# Patient Record
Sex: Female | Born: 1965 | Race: White | Hispanic: No | Smoking: Never smoker
Health system: Southern US, Community
[De-identification: ages and names within clinical notes are randomized; demographics above are authoritative.]

---

## 2021-11-05 ENCOUNTER — Encounter (HOSPITAL_COMMUNITY): Payer: Self-pay

## 2021-11-05 ENCOUNTER — Emergency Department (HOSPITAL_COMMUNITY)
Admission: EM | Admit: 2021-11-05 | Discharge: 2021-11-05 | Disposition: A | Discharge status: Home / Self Care | Payer: Self-pay | Attending: Emergency Medicine | Admitting: Emergency Medicine

## 2021-11-05 ENCOUNTER — Emergency Department (HOSPITAL_COMMUNITY): Payer: Self-pay

## 2021-11-05 DIAGNOSIS — R42 Dizziness and giddiness: Secondary | ICD-10-CM | POA: Insufficient documentation

## 2021-11-05 DIAGNOSIS — Z79899 Other long term (current) drug therapy: Secondary | ICD-10-CM | POA: Insufficient documentation

## 2021-11-05 LAB — COMPREHENSIVE METABOLIC PANEL
ALT: 12 U/L (ref 0–44)
AST: 17 U/L (ref 15–41)
Albumin: 4.2 g/dL (ref 3.5–5.0)
Alkaline Phosphatase: 76 U/L (ref 38–126)
Anion gap: 12 (ref 5–15)
BUN: 14 mg/dL (ref 6–20)
CO2: 24 mmol/L (ref 22–32)
Calcium: 9.6 mg/dL (ref 8.9–10.3)
Chloride: 105 mmol/L (ref 98–111)
Creatinine, Ser: 0.7 mg/dL (ref 0.44–1.00)
GFR, Estimated: >60 mL/min (ref >60)
Glucose, Bld: 157 mg/dL — ABNORMAL HIGH (ref 70–99)
Potassium: 3.7 mmol/L (ref 3.5–5.1)
Sodium: 141 mmol/L (ref 135–145)
Total Bilirubin: 0.3 mg/dL (ref 0.3–1.2)
Total Protein: 7.4 g/dL (ref 6.5–8.1)

## 2021-11-05 LAB — CBC WITH DIFFERENTIAL/PLATELET
Abs Immature Granulocytes: 0.04 10*3/uL (ref 0.00–0.07)
Basophils Absolute: 0.1 10*3/uL (ref 0.0–0.1)
Basophils Relative: 1 %
Eosinophils Absolute: 0.1 10*3/uL (ref 0.0–0.5)
Eosinophils Relative: 1 %
HCT: 40 % (ref 36.0–46.0)
Hemoglobin: 13.3 g/dL (ref 12.0–15.0)
Immature Granulocytes: 0 %
Lymphocytes Relative: 15 %
Lymphs Abs: 1.4 10*3/uL (ref 0.7–4.0)
MCH: 27 pg (ref 26.0–34.0)
MCHC: 33.3 g/dL (ref 30.0–36.0)
MCV: 81.1 fL (ref 80.0–100.0)
Monocytes Absolute: 0.4 10*3/uL (ref 0.1–1.0)
Monocytes Relative: 5 %
Neutro Abs: 7.2 10*3/uL (ref 1.7–7.7)
Neutrophils Relative %: 78 %
Platelets: 289 10*3/uL (ref 150–400)
RBC: 4.93 MIL/uL (ref 3.87–5.11)
RDW: 13.8 % (ref 11.5–15.5)
WBC: 9.2 10*3/uL (ref 4.0–10.5)
nRBC: 0 % (ref 0.0–0.2)

## 2021-11-05 LAB — URINALYSIS, ROUTINE W REFLEX MICROSCOPIC
Bilirubin Urine: NEGATIVE
Glucose, UA: NEGATIVE mg/dL
Hgb urine dipstick: NEGATIVE
Ketones, ur: NEGATIVE mg/dL
Nitrite: NEGATIVE
Protein, ur: NEGATIVE mg/dL
Specific Gravity, Urine: 1.017 (ref 1.005–1.030)
pH: 7 (ref 5.0–8.0)

## 2021-11-05 LAB — TROPONIN I (HIGH SENSITIVITY)
Troponin I (High Sensitivity): 6 ng/L (ref <18)
Troponin I (High Sensitivity): 8 ng/L (ref <18)

## 2021-11-05 MED ORDER — MECLIZINE HCL 25 MG PO TABS
25.0000 mg | ORAL_TABLET | Freq: Three times a day (TID) | ORAL | 0 refills | Status: DC | PRN
Start: 2021-11-05 — End: 2022-10-09

## 2021-11-05 NOTE — ED Provider Notes (Signed)
[facility] EMERGENCY DEPARTMENT Provider Note   CSN: [id number] Arrival date & time: 11/05/21  1557     History  Chief Complaint  Patient presents with   Dizziness    Meredith Ward is a 56 y.o. female with a past medical history significant for right-sided tinnitus for years who presents with concern for 2 episodes of acute onset dizziness, "feeling like she was drunk" since yesterday.  Patient reports that the initial episode happened she was woken up from sleep around 4 a 5 AM, was evaluated at [facility], was found to be somewhat hypothermic at the time, warmed up with bear hugger, no other acute findings noted, spontaneous resolution of symptoms, and patient discharged home.  Patient had a return of acute onset dizziness, feeling like she was drunk, with some inability to perform normal tasks including parking car, finding herself in the world, balance.  With these episodes lasted for a few hours before spontaneous resolution.  Patient has no ongoing dizziness, facial droop, weakness.  She does endorse a mild headache but reports that she has not had her normal caffeine for the day.  Patient denies prior history of strokes, blood clots.   Dizziness     Home Medications Prior to Admission medications   Medication Sig Start Date End Date Taking? Authorizing Provider  meclizine (ANTIVERT) 25 MG tablet Take 1 tablet (25 mg total) by mouth 3 (three) times daily as needed for dizziness. 11/05/21  Yes Shanard Treto H, PA-C      Allergies    Patient has no known allergies.    Review of Systems   Review of Systems  Neurological:  Positive for dizziness.  All other systems reviewed and are negative.  Physical Exam Updated Vital Signs BP (!) 155/89    Pulse 71    Temp 98 F (36.7 C)    Resp 16    Ht 5\' 6"  (1.676 m)    Wt 72.6 kg    SpO2 98%    BMI 25.82 kg/m  Physical Exam Vitals and nursing note reviewed.  Constitutional:      General: She is not  in acute distress.    Appearance: Normal appearance.  HENT:     Head: Normocephalic and atraumatic.     Right Ear: Tympanic membrane, ear canal and external ear normal. There is no impacted cerumen.     Left Ear: Tympanic membrane, ear canal and external ear normal. There is no impacted cerumen.  Eyes:     General:        Right eye: No discharge.        Left eye: No discharge.  Cardiovascular:     Rate and Rhythm: Normal rate and regular rhythm.     Heart sounds: No murmur heard.   No friction rub. No gallop.  Pulmonary:     Effort: Pulmonary effort is normal.     Breath sounds: Normal breath sounds.  Abdominal:     General: Bowel sounds are normal.     Palpations: Abdomen is soft.  Skin:    General: Skin is warm and dry.     Capillary Refill: Capillary refill takes less than 2 seconds.  Neurological:     Mental Status: She is alert and oriented to person, place, and time.     Comments: Cranial nerves II through XII grossly intact.  Intact finger-nose, intact heel-to-shin.  Romberg negative, gait normal.  Alert and oriented x3.  Moves all 4 limbs spontaneously, normal  coordination.  No pronator drift.  Intact strength 5 out of 5 bilateral upper and lower extremities.    Psychiatric:        Mood and Affect: Mood normal.        Behavior: Behavior normal.    ED Results / Procedures / Treatments   Labs (all labs ordered are listed, but only abnormal results are displayed) Labs Reviewed  COMPREHENSIVE METABOLIC PANEL - Abnormal; Notable for the following components:      Result Value   Glucose, Bld 157 (*)    All other components within normal limits  URINALYSIS, ROUTINE W REFLEX MICROSCOPIC - Abnormal; Notable for the following components:   APPearance CLOUDY (*)    Leukocytes,Ua MODERATE (*)    Bacteria, UA FEW (*)    All other components within normal limits  RAPID URINE DRUG SCREEN, HOSP PERFORMED - Abnormal; Notable for the following components:   Opiates NEGATIVE (*)     Cocaine NEGATIVE (*)    Benzodiazepines NEGATIVE (*)    Amphetamines NEGATIVE (*)    Tetrahydrocannabinol NEGATIVE (*)    Barbiturates NEGATIVE (*)    All other components within normal limits  CBC WITH DIFFERENTIAL/PLATELET  TROPONIN I (HIGH SENSITIVITY)  TROPONIN I (HIGH SENSITIVITY)    EKG EKG Interpretation  Date/Time:  Monday November 05 2021 16:09:24 EST Ventricular Rate:  68 PR Interval:  144 QRS Duration: 98 QT Interval:  388 QTC Calculation: 412 R Axis:   77 Text Interpretation: Normal sinus rhythm Right atrial enlargement Incomplete right bundle branch block Borderline ECG No previous ECGs available Confirmed by Linwood Dibbles [phone]) on 11/05/2021 10:43:09 PM  Radiology DG Chest 2 View  Result Date: 11/05/2021 CLINICAL DATA:  Chest pain EXAM: CHEST - 2 VIEW COMPARISON:  03/27/2020 FINDINGS: The heart size and mediastinal contours are within normal limits. Atherosclerotic calcification of the aortic knob. No focal airspace consolidation, pleural effusion, or pneumothorax. The visualized skeletal structures are unremarkable. IMPRESSION: No active cardiopulmonary disease. Electronically Signed   By: Duanne Guess D.O.   On: 11/05/2021 16:52   CT Head Wo Contrast  Result Date: 11/05/2021 CLINICAL DATA:  Dizziness, persistent and recurrent, cardiac or vascular cause suspected. New or worsening headache. EXAM: CT HEAD WITHOUT CONTRAST TECHNIQUE: Contiguous axial images were obtained from the base of the skull through the vertex without intravenous contrast. RADIATION DOSE REDUCTION: This exam was performed according to the departmental dose-optimization program which includes automated exposure control, adjustment of the mA and/or kV according to patient size and/or use of iterative reconstruction technique. COMPARISON:  03/27/2020 FINDINGS: Brain: No evidence of acute infarction, hemorrhage, hydrocephalus, extra-axial collection or mass lesion/mass effect. Patchy white matter  disease of nonspecific etiology, unchanged. Vascular: No hyperdense vessel or unexpected calcification. Skull: Calvarium appears intact. Sinuses/Orbits: Paranasal sinuses and mastoid air cells are clear. Other: None. IMPRESSION: No acute intracranial abnormalities. Mild patchy white matter changes of nonspecific etiology, unchanged. Electronically Signed   By: Burman Nieves M.D.   On: 11/05/2021 17:23    Procedures Procedures    Medications Ordered in ED Medications - No data to display  ED Course/ Medical Decision Making/ A&P                           Medical Decision Making This patient presents to the ED for concern of acute onset dizziness x2, this involves an extensive number of treatment options, and is a complaint that carries with it a high risk of  complications and morbidity. The emergent differential diagnosis includes, but is not limited to, peripheral acoustic lesion, central acoustic lesion, acute stroke, BPPV, labyrinthitis versus other.   Co morbidities that complicate the patient evaluation: History of tinnitus on the right Social Determinants of Health: Patient has good access to care, good compliance  Additional history obtained from patient's daughter. External records from outside source obtained and reviewed including previous emergency department evaluation from yesterday.  Physical Exam: Physical exam performed. The pertinent findings include: Nonfocal neurologic exam, overall well-appearing patient, no evidence of lesion or other abnormality bilateral ears  Lab Tests: I Ordered, and personally interpreted labs.  The pertinent results include: Unremarkable CBC, CMP with mildly elevated blood glucose, 157.  Negative troponin x2, negative UDS, urinalysis with moderate leukocytes, few bacteria in context of no urinary tract infection symptoms.   Imaging Studies: I ordered imaging studies including CT head without contrast, plain film radiograph of the chest. I  independently visualized and interpreted imaging which showed no evidence of acute intracranial abnormality, large brain bleed, no evidence of intrathoracic abnormality, pneumonia, or other infiltrate. I agree with the radiologist interpretation.   Cardiac Monitoring:  The patient was maintained on a cardiac monitor.  My attending physician Dr. Lynelle Doctor viewed and interpreted the cardiac monitored which showed an underlying rhythm of: incomplete right bundle branch, some right atrial enlargement, no evidence of ischemia   Disposition: After consideration of the diagnostic results and the patients response to treatment, I feel that patient would benefit from follow-up with ENT for possible evaluation of Mnire's disease.  Consider admission for this patient for possible TIA work-up, however I think that it is fairly improbable that patient had isolated TIA of the posterior circulation with no residual symptoms x2.  She has no focal neurologic deficits on my exam today, I have no clinical concern for an acute stroke at this time.  We will discharge patient with prescription for Antivert, and plan to follow-up with ENT at her earliest convenience.  Patient or stands and agrees to plan, discharged in stable condition at this time.    Final Clinical Impression(s) / ED Diagnoses Final diagnoses:  Dizziness    Rx / DC Orders ED Discharge Orders          Ordered    meclizine (ANTIVERT) 25 MG tablet  3 times daily PRN        11/05/21 2318              Alicen Donalson, Harrel Carina, PA-C 11/06/21 0034    Linwood Dibbles, MD 11/07/21 0002

## 2021-11-05 NOTE — Discharge Instructions (Addendum)
As we discussed I have some suspicion that you may have something called Mnire's disease.  This can be tied to caffeine intake, sometimes associated with sodium.  Decreasing sodium in your diet, your caffeine intake may help improve your symptoms.  I prescribed you a medication called Antivert which may help with the symptoms when they occur.  However I do recommend further evaluation by ENT, for formal diagnosis.  If you are formally diagnosed with this condition, they will sometimes place you on a diuretic which is a medication that can help to decrease your symptoms (because of how it helps you to excrete excess sodium).  The rest of your lab work and evaluation was unremarkable today.  If you have worsening symptoms, or persistence of the symptoms recommend the return to the emergency department for further evaluation, otherwise follow-up with ENT.  It was a pleasure taking care of you today, I appreciate your patience today.

## 2021-11-05 NOTE — ED Provider Triage Note (Signed)
Emergency Medicine Provider Triage Evaluation Note  Meredith Ward , a 56 y.o. female  was evaluated in triage.  Pt complains of headache, chest pain, and dizziness. She was seen at [facility] yesterday for the same however did not have chest pain. She states that since waking up yesterday she has felt globally weak and very dizzy like she has a few too many drinks however she denies any alcohol use. She states that her symptoms have been intermittent.  Today they started after she drove at work about an hour prior to arrival. She denies any fevers.  She did not have any CT imaging done yesterday on her head.  Yesterday her COVID test were negative.  Reports feeling very nauseous.  Physical Exam  BP (!) 166/113    Pulse 62    Temp 97.7 F (36.5 C) (Oral)    Resp 16    Ht 5\' 6"  (1.676 m)    Wt 72.6 kg    SpO2 100%    BMI 25.82 kg/m  Gen:   Awake,\appears uncomfortable  Resp:  Normal effort  MSK:   Moves extremities without difficulty  Other:  Normal finger-nose-finger bilaterally.  Full EOMs without nystagmus.  Medical Decision Making  Medically screening exam initiated at 4:32 PM.  Appropriate orders placed.  Arnelle Nale was informed that the remainder of the evaluation will be completed by another provider, this initial triage assessment does not replace that evaluation, and the importance of remaining in the ED until their evaluation is complete.     Nicholes Calamity, Cristina Gong 11/05/21 [phone]

## 2021-11-05 NOTE — ED Triage Notes (Signed)
Pt arrives POV for eval of dizziness and "feeling like she is drunk". Pt was seen yesterday at [facility] for same w/ no conclusion as to cause. Did not receive a CT scan yesterday. Pt reports this dizziness has come and gone, but felt worse again this morning. Denies weakness/numbness.

## 2021-11-06 LAB — RAPID URINE DRUG SCREEN, HOSP PERFORMED
Amphetamines: NOT DETECTED
Barbiturates: NOT DETECTED
Benzodiazepines: NOT DETECTED
Cocaine: NOT DETECTED
Opiates: NOT DETECTED
Tetrahydrocannabinol: NOT DETECTED

## 2022-08-09 ENCOUNTER — Ambulatory Visit (INDEPENDENT_AMBULATORY_CARE_PROVIDER_SITE_OTHER): Payer: Self-pay | Admitting: Internal Medicine

## 2022-08-09 ENCOUNTER — Encounter: Payer: Self-pay | Admitting: Internal Medicine

## 2022-08-09 VITALS — BP 128/88 | HR 94 | Temp 98.4°F | Resp 18 | Ht 67.0 in | Wt 160.2 lb

## 2022-08-09 DIAGNOSIS — R590 Localized enlarged lymph nodes: Secondary | ICD-10-CM

## 2022-08-09 DIAGNOSIS — J029 Acute pharyngitis, unspecified: Secondary | ICD-10-CM

## 2022-08-09 MED ORDER — IBUPROFEN 600 MG PO TABS: 600.0000 mg | ORAL_TABLET | Freq: Four times a day (QID) | ORAL | 0 refills | Status: DC | PRN

## 2022-08-09 MED ORDER — AMOXICILLIN-POT CLAVULANATE 875-125 MG PO TABS: 1.0000 | ORAL_TABLET | Freq: Two times a day (BID) | ORAL | 0 refills | Status: DC

## 2022-08-09 NOTE — Progress Notes (Signed)
   Acute Office Visit  Subjective:     Patient ID: Meredith Ward, female    DOB: Dec 28, 1965, 56 y.o.   MRN: [id number]  Chief Complaint  Patient presents with   Swollen Lymphnodes   Fever   Sore Throat    Fever  This is a new problem. The current episode started in the past 7 days. The problem occurs intermittently. The problem has been unchanged. Her temperature was unmeasured prior to arrival. Associated symptoms include a sore throat. Pertinent negatives include no abdominal pain, chest pain or ear pain. She has tried nothing for the symptoms. The treatment provided no relief.  Sore Throat  This is a new problem. The current episode started in the past 7 days. The problem has been unchanged. The pain is worse on the right side. The fever has been present for 3 to 4 days. The pain is moderate. Pertinent negatives include no abdominal pain or ear pain. She has tried nothing for the symptoms. The treatment provided no relief.   Patient is in today for right side sore throat with right neck swelling started 5 days ago.   Review of Systems  Constitutional:  Positive for fever.  HENT:  Positive for sore throat. Negative for ear pain.   Cardiovascular:  Negative for chest pain.  Gastrointestinal:  Negative for abdominal pain.        Objective:    BP 128/88 (BP Location: Left Arm, Patient Position: Sitting)   Pulse 94   Temp 98.4 F (36.9 C)   Resp 18   Ht 5\' 7"  (1.702 m)   Wt 160 lb 4 oz (72.7 kg)   SpO2 97%   BMI 25.10 kg/m    Physical Exam Constitutional:      Appearance: She is well-developed.  HENT:     Head: Normocephalic and atraumatic.     Mouth/Throat:     Pharynx: Pharyngeal swelling present.     Tonsils: 3+ on the right.  Cardiovascular:     Rate and Rhythm: Normal rate and regular rhythm.  Musculoskeletal:     Cervical back: Normal range of motion.  Neurological:     General: No focal deficit present.     Mental Status: She is alert.     No results  found for any visits on 08/09/22.      Assessment & Plan:   Problem List Items Addressed This Visit   None Visit Diagnoses     Sore throat    -  Primary   Relevant Medications   amoxicillin-clavulanate (AUGMENTIN) 875-125 MG tablet   ibuprofen (ADVIL) 600 MG tablet   Other Relevant Orders   POCT rapid strep A   Cervical lymphadenopathy       Relevant Medications   amoxicillin-clavulanate (AUGMENTIN) 875-125 MG tablet   ibuprofen (ADVIL) 600 MG tablet   Other Relevant Orders   POCT rapid strep A       Meds ordered this encounter  Medications   amoxicillin-clavulanate (AUGMENTIN) 875-125 MG tablet    Sig: Take 1 tablet by mouth 2 (two) times daily.    Dispense:  14 tablet    Refill:  0   ibuprofen (ADVIL) 600 MG tablet    Sig: Take 1 tablet (600 mg total) by mouth every 6 (six) hours as needed.    Dispense:  30 tablet    Refill:  0    Return in about 1 week (around 08/16/2022).  08/18/2022, MD

## 2022-10-09 ENCOUNTER — Encounter: Payer: Self-pay | Admitting: Internal Medicine

## 2022-10-09 ENCOUNTER — Ambulatory Visit: Payer: Self-pay | Admitting: Internal Medicine

## 2022-10-09 VITALS — BP 138/100 | HR 70 | Temp 97.8°F | Resp 18 | Ht 66.0 in | Wt 165.1 lb

## 2022-10-09 DIAGNOSIS — J00 Acute nasopharyngitis [common cold]: Secondary | ICD-10-CM

## 2022-10-09 DIAGNOSIS — H8101 Meniere's disease, right ear: Secondary | ICD-10-CM | POA: Insufficient documentation

## 2022-10-09 MED ORDER — BACITRACIN 500 UNIT/GM EX OINT: 1.0000 | TOPICAL_OINTMENT | Freq: Two times a day (BID) | CUTANEOUS | 0 refills | Status: DC

## 2022-10-09 MED ORDER — FEXOFENADINE HCL 180 MG PO TABS: 180.0000 mg | ORAL_TABLET | Freq: Every day | ORAL | 1 refills | Status: DC

## 2022-10-09 MED ORDER — MECLIZINE HCL 25 MG PO TABS: 25.0000 mg | ORAL_TABLET | Freq: Three times a day (TID) | ORAL | 3 refills | Status: DC | PRN

## 2022-10-09 NOTE — Progress Notes (Signed)
   Office Visit  Subjective   Patient ID: Meredith Ward   DOB: March 01, 1966   Age: 57 y.o.   MRN: [id number]   Chief Complaint Chief Complaint  Patient presents with   Follow-up     History of Present Illness HPI   Past Medical History No past medical history on file.   Allergies No Known Allergies   Review of Systems ROS     Objective:    Vitals BP (!) 138/100 (BP Location: Left Arm, Patient Position: Sitting, Cuff Size: Normal)   Pulse 70   Temp 97.8 F (36.6 C)   Resp 18   Ht 5\' 6"  (1.676 m)   Wt 165 lb 2 oz (74.9 kg)   SpO2 96%   BMI 26.65 kg/m    Physical Examination Physical Exam     Assessment & Plan:   No problem-specific Assessment & Plan notes found for this encounter.    No follow-ups on file.   Garwin Brothers, MD

## 2022-10-10 NOTE — Assessment & Plan Note (Signed)
Continue using meclizine.

## 2022-10-10 NOTE — Assessment & Plan Note (Signed)
I will give her antihistamine and nasal spray she will use during this time of the year.  If her symptoms are not better then she will call.

## 2022-10-15 ENCOUNTER — Other Ambulatory Visit: Payer: Self-pay

## 2022-10-15 MED ORDER — MECLIZINE HCL 25 MG PO TABS: 25.0000 mg | ORAL_TABLET | Freq: Three times a day (TID) | ORAL | 3 refills | Status: DC | PRN

## 2022-10-15 MED ORDER — MECLIZINE HCL 25 MG PO TABS: 25.0000 mg | ORAL_TABLET | Freq: Three times a day (TID) | ORAL | 3 refills | Status: AC | PRN

## 2023-07-28 ENCOUNTER — Ambulatory Visit: Payer: Self-pay | Admitting: Student

## 2023-07-28 ENCOUNTER — Encounter: Payer: Self-pay | Admitting: Student

## 2023-07-28 VITALS — BP 130/86 | HR 89 | Temp 98.1°F | Resp 18 | Ht 66.0 in | Wt 173.0 lb

## 2023-07-28 DIAGNOSIS — J029 Acute pharyngitis, unspecified: Secondary | ICD-10-CM

## 2023-07-28 DIAGNOSIS — R197 Diarrhea, unspecified: Secondary | ICD-10-CM

## 2023-07-28 DIAGNOSIS — H6502 Acute serous otitis media, left ear: Secondary | ICD-10-CM | POA: Insufficient documentation

## 2023-07-28 DIAGNOSIS — H8101 Meniere's disease, right ear: Secondary | ICD-10-CM

## 2023-07-28 LAB — POC INFLUENZA A&B (BINAX/QUICKVUE)
Influenza A, POC: NEGATIVE
Influenza B, POC: NEGATIVE

## 2023-07-28 LAB — POC COVID19 BINAXNOW: SARS Coronavirus 2 Ag: NEGATIVE

## 2023-07-28 MED ORDER — CEFDINIR 300 MG PO CAPS: 600.0000 mg | ORAL_CAPSULE | Freq: Every day | ORAL | 0 refills | Status: AC

## 2023-07-28 MED ORDER — CEFDINIR 300 MG PO CAPS: 600.0000 mg | ORAL_CAPSULE | Freq: Two times a day (BID) | ORAL | 0 refills | Status: DC

## 2023-07-28 MED ORDER — ONDANSETRON HCL 4 MG PO TABS: 4.0000 mg | ORAL_TABLET | Freq: Three times a day (TID) | ORAL | 0 refills | Status: AC | PRN

## 2023-07-28 MED ORDER — CLONAZEPAM 0.25 MG PO TBDP: 0.1250 mg | ORAL_TABLET | Freq: Two times a day (BID) | ORAL | 0 refills | Status: DC | PRN

## 2023-07-28 NOTE — Assessment & Plan Note (Signed)
For acute flare she will use clonazepam 0.5 mg twice a day as needed and zofran 4 mg as needed for nausea and vomiting. Instructions provided for hydration and rest.  A work note was signed for the patient.

## 2023-07-28 NOTE — Progress Notes (Signed)
Established Patient Office Visit  Subjective   Patient ID: Meredith Ward, female    DOB: March 04, 1966  Age: 57 y.o. MRN: [id number]  Chief Complaint  Patient presents with   Dizziness    One and a half years ago dx. [facility] dx. Since last Tues. Vomitting, diarrhea, dizziness. Constant ringing in right ear. Has had a sore throat on and off.    HPI  Meredith Ward is a 57 year old female who presents with complaints of vomiting, diarrhea and ringing in right ear, and dizziness. Her initial symptoms began 2 weeks ago with sore throat, runny nose, and mucus production. On last week Wednesday she was at work when she experienced the room began spinning and she vomited. She has a history of meniere's disease diagnosed 1.5 years ago, she has meclizine but she threw that up also. She reports this is her worst flare since being diagnosed. To support diagnosis she has eliminated foods that increase symptoms.  She denies syncope, extremity weakness,    Review of Systems  Constitutional: Negative.   HENT:  Positive for tinnitus.   Eyes: Negative.   Respiratory: Negative.    Cardiovascular: Negative.   Gastrointestinal:  Positive for diarrhea, nausea and vomiting.  Musculoskeletal: Negative.   Skin: Negative.   Neurological:  Positive for dizziness.      Objective:     BP 130/86 (BP Location: Left Arm, Patient Position: Sitting)   Pulse 89   Temp 98.1 F (36.7 C)   Resp 18   Ht 5\' 6"  (1.676 m)   Wt 173 lb (78.5 kg)   SpO2 99%   BMI 27.92 kg/m    Physical Exam Vitals reviewed.  Constitutional:      Appearance: She is ill-appearing.  HENT:     Head: Normocephalic and atraumatic.     Right Ear: Tympanic membrane, ear canal and external ear normal. Decreased hearing noted. No swelling or tenderness.     Left Ear: Hearing normal. No swelling or tenderness. A middle ear effusion is present. Tympanic membrane is retracted and bulging. Tympanic membrane has decreased mobility.      Nose: Congestion present.     Mouth/Throat:     Mouth: Mucous membranes are moist.  Eyes:     Conjunctiva/sclera: Conjunctivae normal.  Cardiovascular:     Rate and Rhythm: Normal rate and regular rhythm.     Pulses: Normal pulses.     Heart sounds: Normal heart sounds.  Pulmonary:     Effort: Pulmonary effort is normal.     Breath sounds: Normal breath sounds.  Abdominal:     General: Bowel sounds are normal.     Palpations: Abdomen is soft.  Musculoskeletal:        General: Normal range of motion.  Skin:    General: Skin is warm and dry.     Capillary Refill: Capillary refill takes less than 2 seconds.     Coloration: Skin is pale.  Neurological:     Mental Status: She is alert and oriented to person, place, and time.     Cranial Nerves: No cranial nerve deficit.     Sensory: No sensory deficit.     Motor: No weakness.     Coordination: Coordination normal.  Psychiatric:        Mood and Affect: Mood normal.        Behavior: Behavior normal.      Results for orders placed or performed in visit on 07/28/23  POC Influenza A&B(BINAX/QUICKVUE)  Result Value Ref Range   Influenza A, POC Negative Negative   Influenza B, POC Negative Negative  POC COVID-19 BinaxNow  Result Value Ref Range   SARS Coronavirus 2 Ag Negative Negative      The ASCVD Risk score (Arnett DK, et al., 2019) failed to calculate for the following reasons:   Cannot find a previous HDL lab   Cannot find a previous total cholesterol lab    Assessment & Plan:   Problem List Items Addressed This Visit     Meniere disease, right - Primary    For acute flare she will use clonazepam 0.5 mg twice a day as needed and zofran 4 mg as needed for nausea and vomiting. Instructions provided for hydration and rest.  A work note was signed for the patient.       Relevant Medications   ondansetron (ZOFRAN) 4 MG tablet   Acute serous otitis media of left ear    I will treat with cefdinir 300 mg twice a day        Relevant Medications   cefdinir (OMNICEF) 300 MG capsule   Other Visit Diagnoses     Sore throat       Relevant Orders   POC Influenza A&B(BINAX/QUICKVUE) (Completed)   Diarrhea, unspecified type       Relevant Orders   POC COVID-19 BinaxNow (Completed)       No follow-ups on file.    Edwena Blow, NP

## 2023-07-28 NOTE — Assessment & Plan Note (Signed)
I will treat with cefdinir 300 mg twice a day

## 2023-07-28 NOTE — Patient Instructions (Addendum)
Use clonazepam twice daily as needed, be sure to not drive or make any important decisions while using this.

## 2023-11-30 IMAGING — DX DG CHEST 2V
2 series · 2 of 2 positions shown · non-contrast
Comparison: 03/27/2020

CLINICAL DATA: Chest pain

EXAM:
CHEST - 2 VIEW

[chest pa]
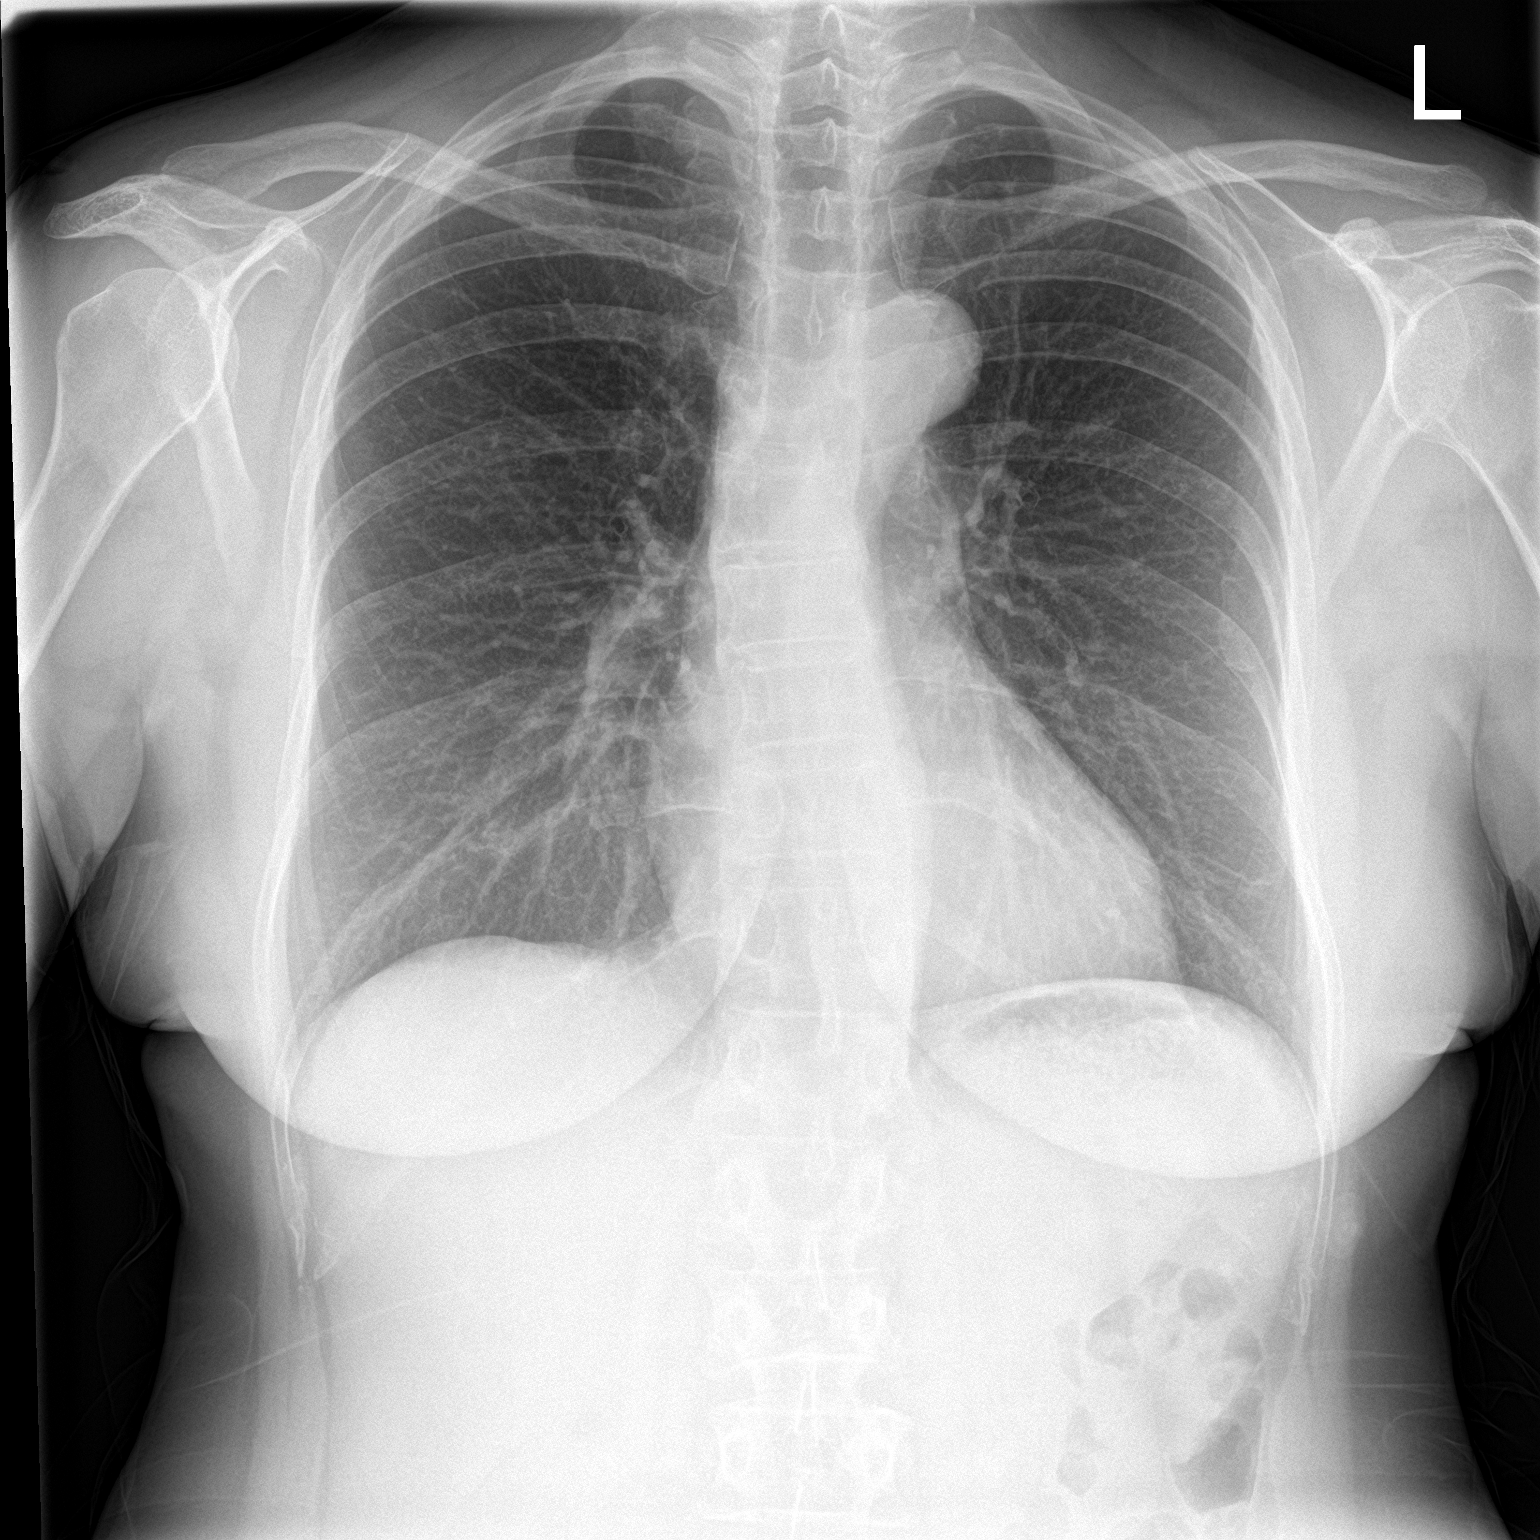

[chest lat]
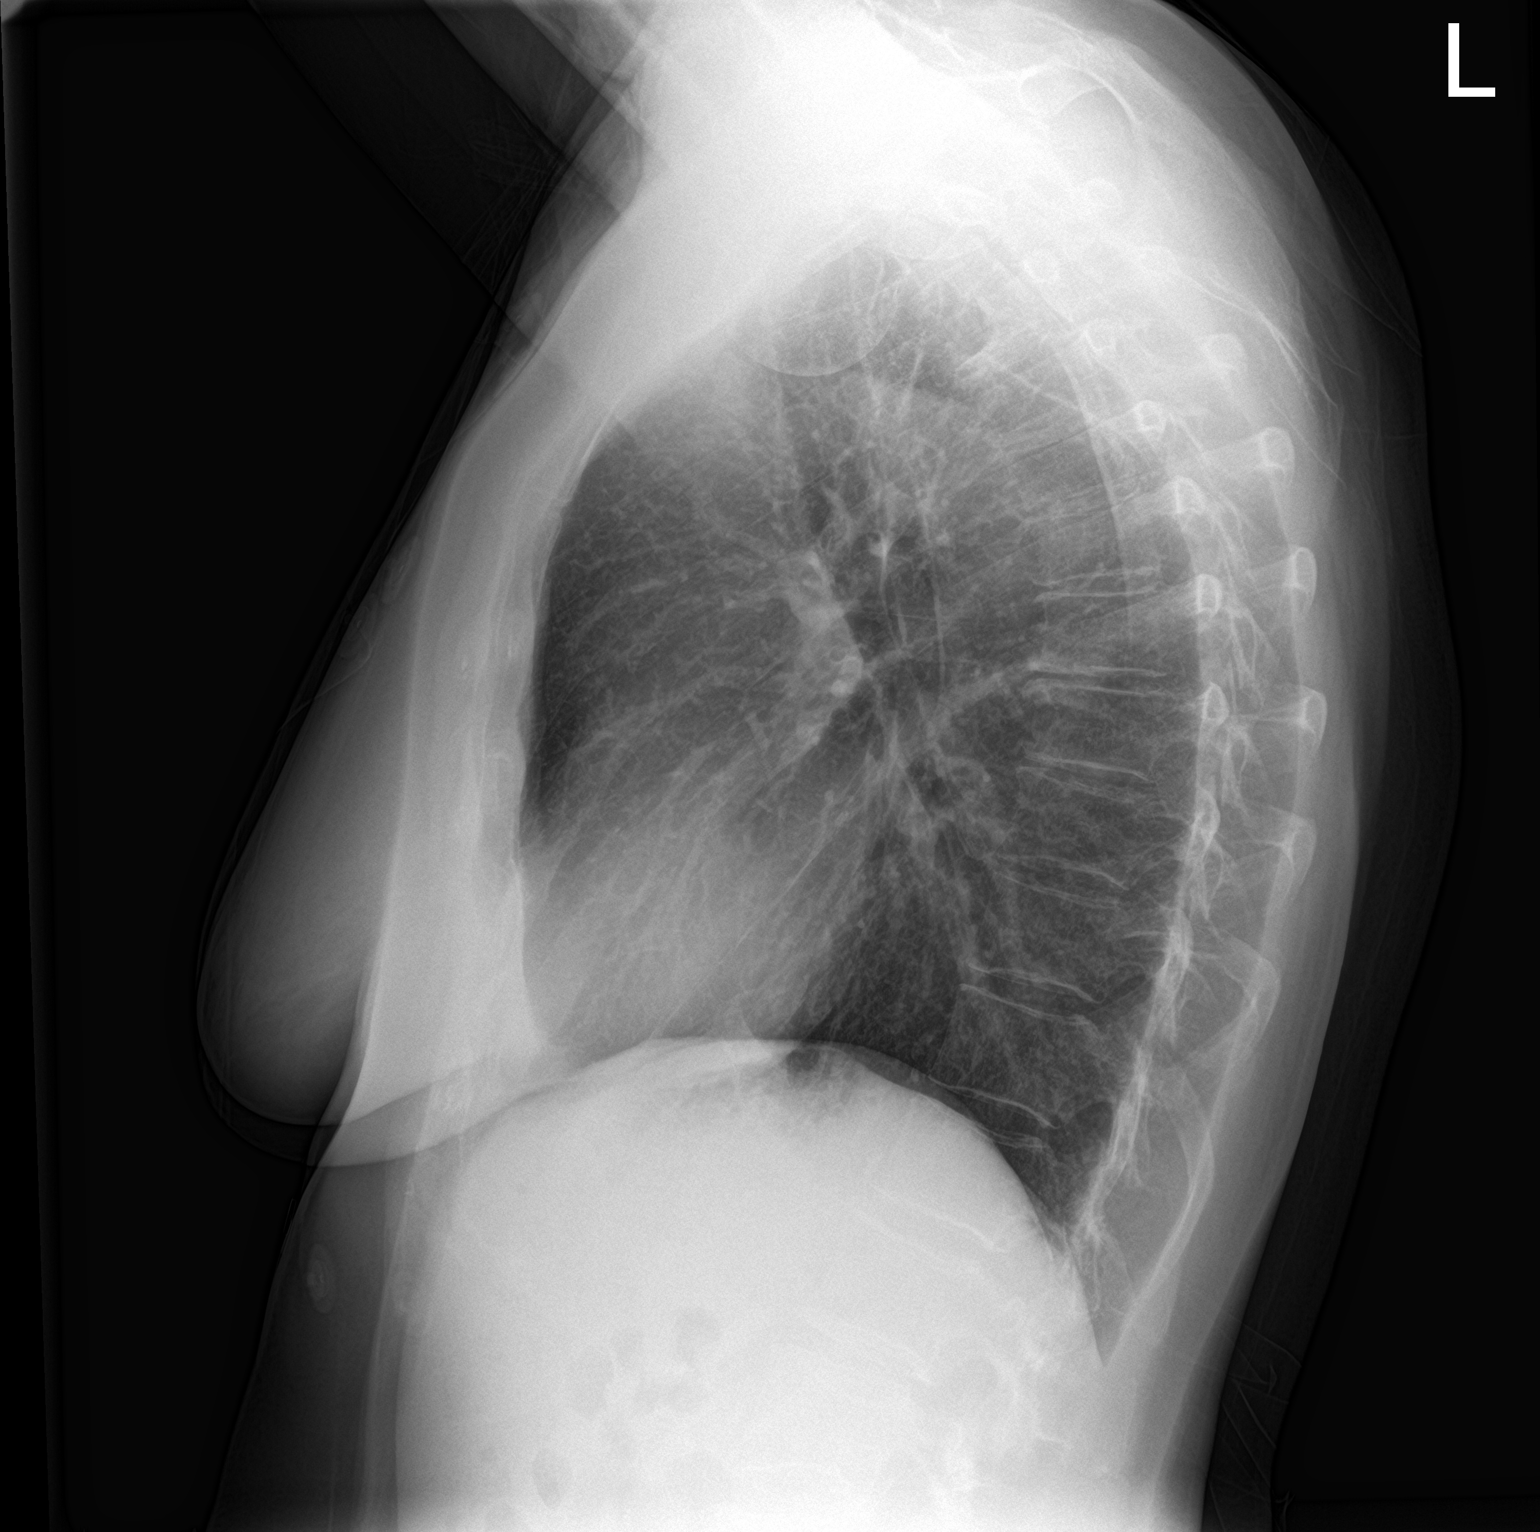

[2 of 2 positions shown; findings below may reference images not displayed]

FINDINGS: The heart size and mediastinal contours are within normal limits.
Atherosclerotic calcification of the aortic knob. No focal airspace
consolidation, pleural effusion, or pneumothorax. The visualized
skeletal structures are unremarkable.
IMPRESSION: No active cardiopulmonary disease.

## 2023-11-30 IMAGING — CT CT HEAD W/O CM
3 of 4 series · 16 of 47 positions shown, 19 images · non-contrast
Comparison: 03/27/2020

CLINICAL DATA: Dizziness, persistent and recurrent, cardiac or
vascular cause suspected. New or worsening headache.



[Series 3: head 2.0 h70h · axial · 0.44mm/px · z∈[-59,+79]mm · 10 of 77 slices shown, 13 images]
[im 4/77  brain]
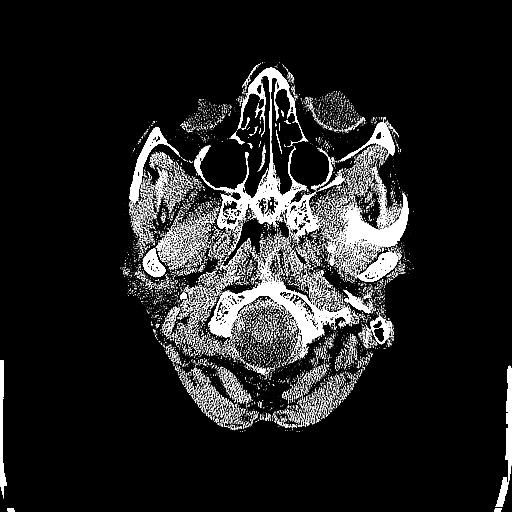
[im 4/77  bone]
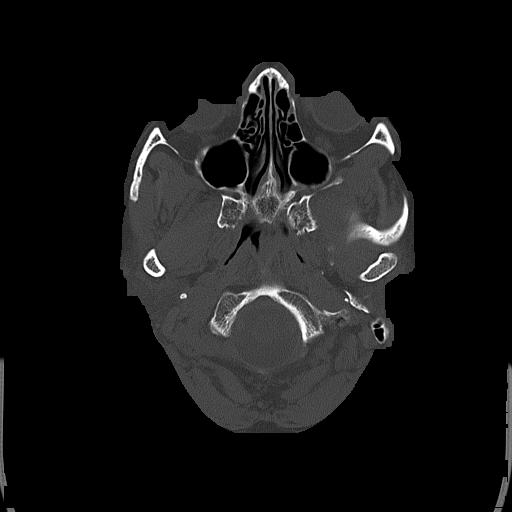
[im 12/77  brain]
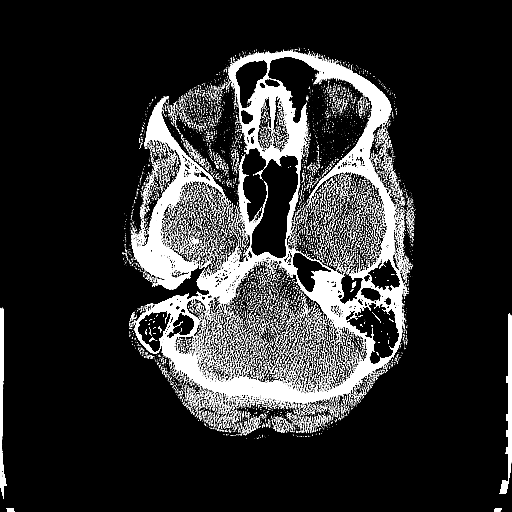
[im 20/77  brain]
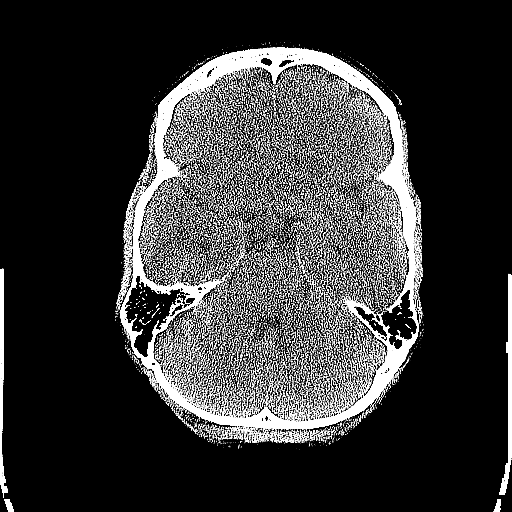
[im 27/77  brain]
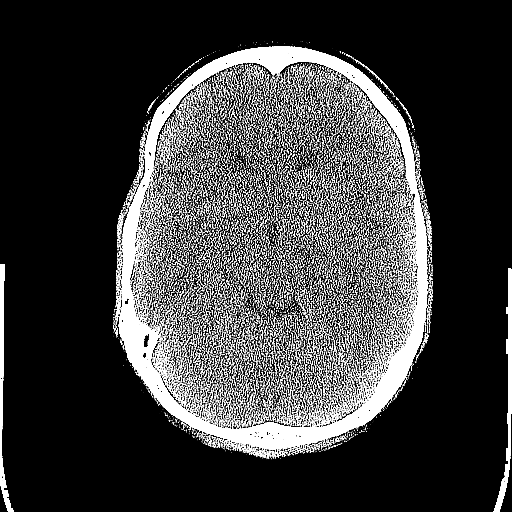
[im 35/77  brain]
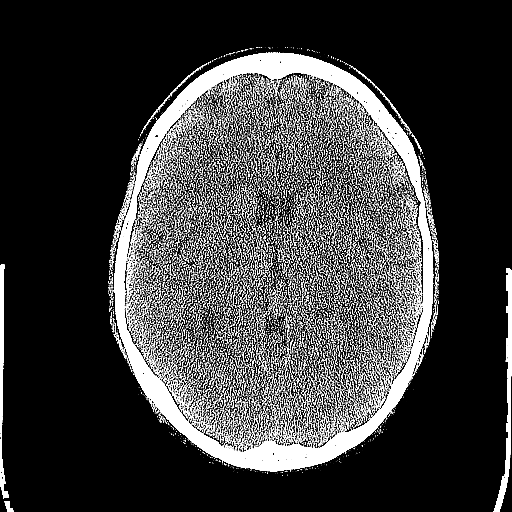
[im 35/77  bone]
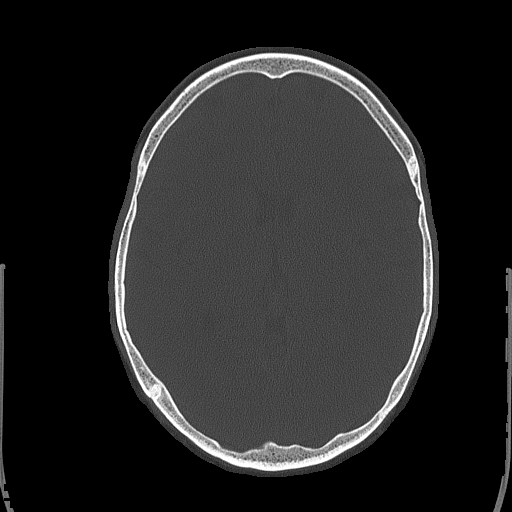
[im 42/77  brain]
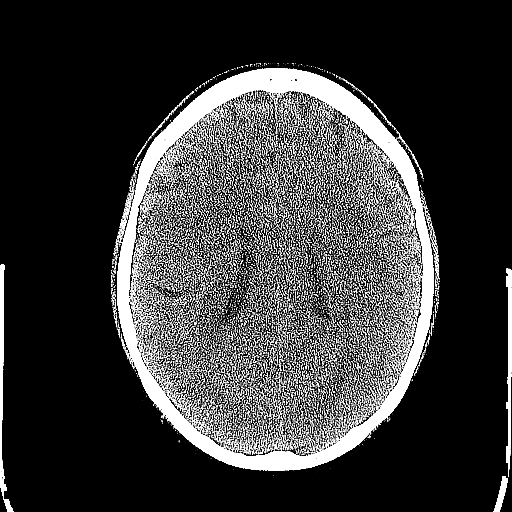
[im 50/77  brain]
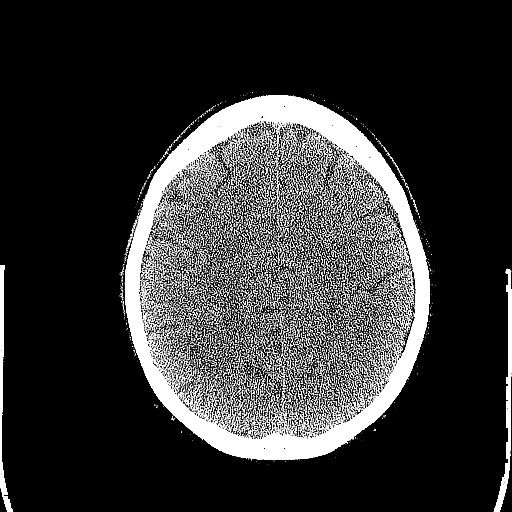
[im 58/77  brain]
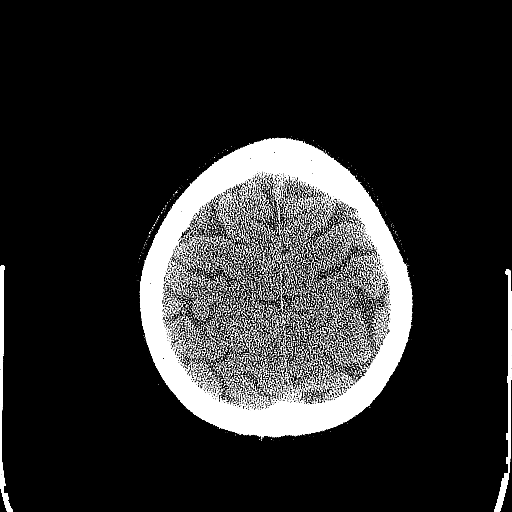
[im 65/77  brain]
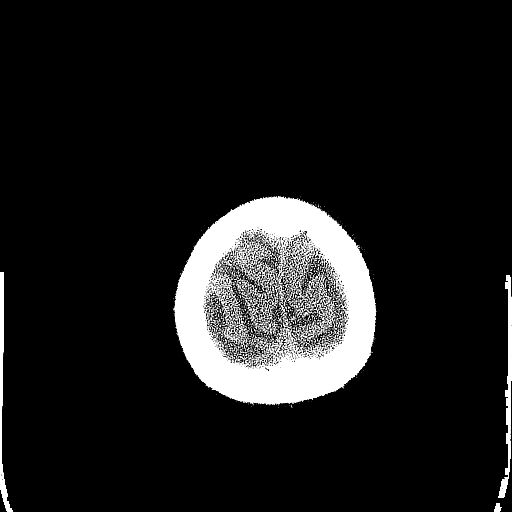
[im 65/77  bone]
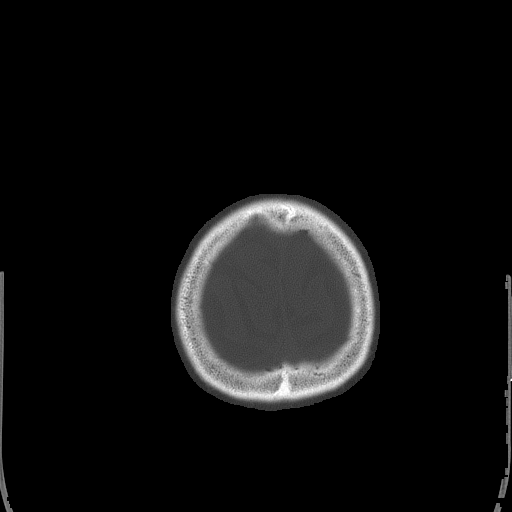
[im 73/77  brain]
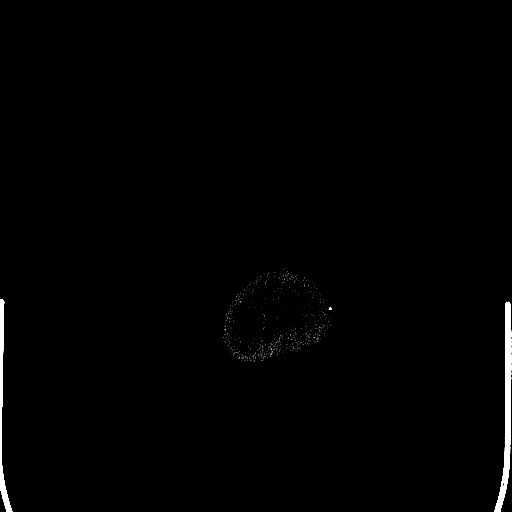

[Series 5: head 3.0 mpr cor · coronal · 0.31mm/px · 3 of 67 slices shown]
[im 23/67  brain]
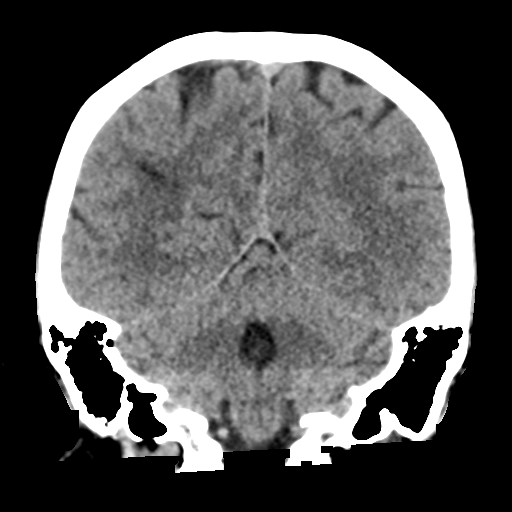
[im 30/67  brain]
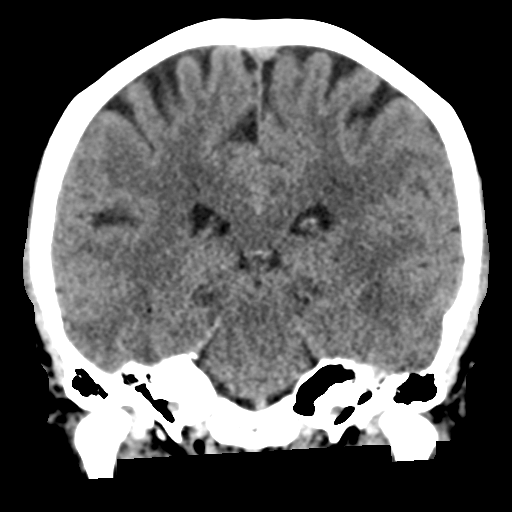
[im 37/67  brain]
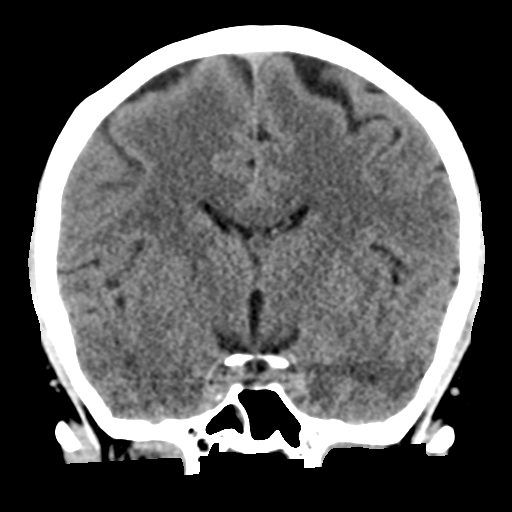

[Series 6: head 3.0 mpr sag · sagittal · 0.31mm/px · 3 of 53 slices shown]
[im 18/53  brain]
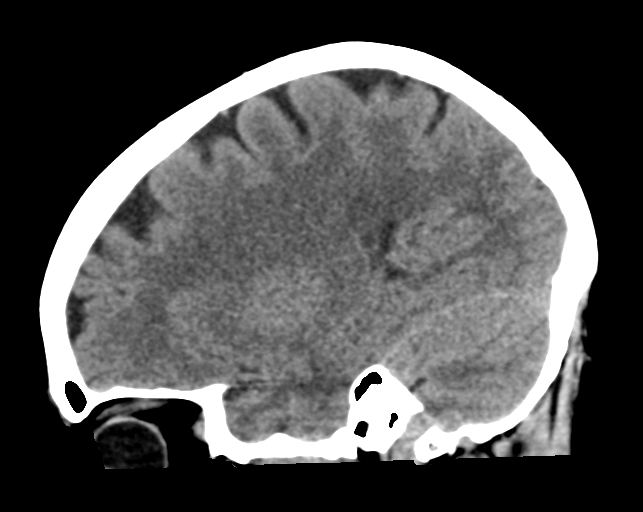
[im 27/53  brain]
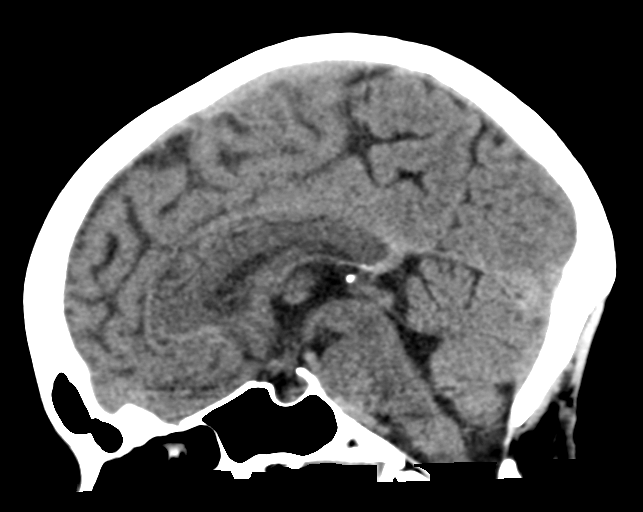
[im 35/53  brain]
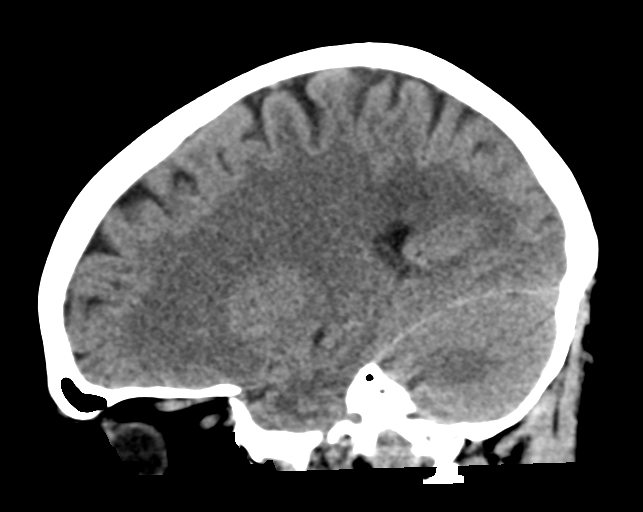

[16 of 47 positions shown; findings below may reference images not displayed]

FINDINGS: Brain: No evidence of acute infarction, hemorrhage, hydrocephalus,
extra-axial collection or mass lesion/mass effect. Patchy white
matter disease of nonspecific etiology, unchanged.

Vascular: No hyperdense vessel or unexpected calcification.

Skull: Calvarium appears intact.

Sinuses/Orbits: Paranasal sinuses and mastoid air cells are clear.

Other: None.
IMPRESSION: No acute intracranial abnormalities. Mild patchy white matter
changes of nonspecific etiology, unchanged.

## 2024-05-17 ENCOUNTER — Encounter: Payer: Self-pay | Admitting: Internal Medicine

## 2024-05-17 ENCOUNTER — Ambulatory Visit: Payer: Self-pay | Admitting: Internal Medicine

## 2024-05-17 VITALS — BP 126/80 | HR 71 | Temp 98.0°F | Resp 18 | Ht 66.0 in | Wt 178.5 lb

## 2024-05-17 DIAGNOSIS — M25572 Pain in left ankle and joints of left foot: Secondary | ICD-10-CM | POA: Insufficient documentation

## 2024-05-17 MED ORDER — DICLOFENAC SODIUM 75 MG PO TBEC: 75.0000 mg | DELAYED_RELEASE_TABLET | Freq: Two times a day (BID) | ORAL | 0 refills | Status: AC

## 2024-05-17 NOTE — Progress Notes (Signed)
   Acute Office Visit  Subjective:     Patient ID: Meredith Ward, female    DOB: 1966-07-19, 58 y.o.   MRN: [id number]  Chief Complaint  Patient presents with   Foot Swelling    Left foot swelling up to ankle    HPI Patient is in today for left ankle swelling and pain after her foot stuck In a hole 3 weeks ago.  She put ice on it.  She says that for 3 weeks she has significant pain and cannot walk because of the pain.  Her left ankle is still swollen and pain is on movement of left ankle.  She also has swelling of left ankle and slight swelling of left cough as well.  Review of Systems  Constitutional: Negative.   Respiratory: Negative.    Musculoskeletal:  Positive for joint pain.        Objective:    BP 126/80 (BP Location: Left Arm, Patient Position: Sitting, Cuff Size: Normal)   Pulse 71   Temp 98 F (36.7 C)   Resp 18   Ht 5' 6 (1.676 m)   Wt 178 lb 8 oz (81 kg)   SpO2 97%   BMI 28.81 kg/m    Physical Exam Constitutional:      Appearance: Normal appearance.  Musculoskeletal:        General: Tenderness present.     Left lower leg: Edema present.     Comments:   Tender of movement of left ankle and tenderness of lateral malleolus  Neurological:     General: No focal deficit present.     Mental Status: She is alert and oriented to person, place, and time.     No results found for any visits on 05/17/24.      Assessment & Plan:   Problem List Items Addressed This Visit       Other   Acute left ankle pain - Primary    I will do x-ray of left ankle and then re-evaluate.  I will also giver diclofenac  sodium 75 mg twice a day for pain control.       No orders of the defined types were placed in this encounter.   No follow-ups on file.  Roetta Dare, MD

## 2024-05-17 NOTE — Assessment & Plan Note (Signed)
 I will do x-ray of left ankle and then re-evaluate.  I will also giver diclofenac  sodium 75 mg twice a day for pain control.

## 2024-05-18 ENCOUNTER — Encounter: Payer: Self-pay | Admitting: Internal Medicine

## 2024-10-04 ENCOUNTER — Encounter: Payer: Self-pay | Admitting: Internal Medicine

## 2024-10-04 ENCOUNTER — Ambulatory Visit: Payer: Self-pay | Admitting: Internal Medicine

## 2024-10-04 VITALS — BP 130/100 | HR 65 | Temp 97.6°F | Resp 18 | Ht 66.0 in | Wt 186.4 lb

## 2024-10-04 DIAGNOSIS — M25572 Pain in left ankle and joints of left foot: Secondary | ICD-10-CM

## 2024-10-04 DIAGNOSIS — I10 Essential (primary) hypertension: Secondary | ICD-10-CM

## 2024-10-04 MED ORDER — DICLOFENAC SODIUM 1 % EX GEL: 2.0000 g | Freq: Four times a day (QID) | CUTANEOUS | 0 refills | Status: AC

## 2024-10-04 NOTE — Assessment & Plan Note (Signed)
 She will apply voltaren  gel  2-3 times a day and Ace bandage was applied in office. I will refer her to see orthopedic for evaluation

## 2024-10-04 NOTE — Progress Notes (Signed)
" ° °  Office Visit  Subjective   Patient ID: Meredith Ward   DOB: Sep 19, 1966   Age: 59 y.o.   MRN: [id number]   Chief Complaint Chief Complaint  Patient presents with   Office visit    Left foot still having issues     History of Present Illness   59 years old female who is complaining of pain in her left ankle since August.  She does not remember how this pain started.  I saw her in August and I have started her on diclofenac  sodium twice a day.  I did x-ray that was normal.  She says that she hurt every day when she walk.  Someday pain is so bad that she has difficulty walking.  Patient is around left ankle.  She also noticed some numbness in her toes.  She does not have any pain when she is sitting pain get worse when she puts pressure on ankle.  She says that diclofenac  sodium did help but once she stopped that her pain comes back.    Her blood pressure is also high with diastolic of 100.  Her sister has hypertension and she takes medication but patient says that her daughter checked her blood pressure at home and it has always been normal.  Past Medical History History reviewed. No pertinent past medical history.   Allergies Allergies[1]   Review of Systems Review of Systems  Musculoskeletal:  Positive for joint pain.       Objective:    Vitals BP (!) 130/100   Pulse 65   Temp 97.6 F (36.4 C)   Resp 18   Ht 5' 6 (1.676 m)   Wt 186 lb 6 oz (84.5 kg)   SpO2 95%   BMI 30.08 kg/m    Physical Examination Physical Exam Constitutional:      Appearance: Normal appearance.  Musculoskeletal:     Comments: Slight tenderness below left later  maleolus  Neurological:     General: No focal deficit present.     Mental Status: She is alert and oriented to person, place, and time.        Assessment & Plan:   Primary hypertension She will watch her BP at home.   Acute left ankle pain She will apply voltaren  gel  2-3 times a day and Ace bandage was applied in office. I  will refer her to see orthopedic for evaluation    Return in about 1 month (around 11/04/2024).   Roetta Dare, MD      [1] No Known Allergies  "

## 2024-10-04 NOTE — Assessment & Plan Note (Signed)
 She will watch her BP at home.

## 2024-11-03 ENCOUNTER — Ambulatory Visit: Payer: Self-pay | Admitting: Internal Medicine
